# Patient Record
Sex: Male | Born: 1980 | Race: White | Hispanic: No | Marital: Married | Smoking: Former smoker
Health system: Southern US, Community
[De-identification: ages and names within clinical notes are randomized; demographics above are authoritative.]

## PROBLEM LIST (undated history)

## (undated) HISTORY — PX: BICEPS TENDON REPAIR: SHX566

---

## 2016-08-24 ENCOUNTER — Ambulatory Visit
Admission: EM | Admit: 2016-08-24 | Discharge: 2016-08-24 | Disposition: A | Discharge status: Home / Self Care | Payer: 59 | Attending: Family Medicine | Admitting: Family Medicine

## 2016-08-24 DIAGNOSIS — B356 Tinea cruris: Secondary | ICD-10-CM

## 2016-08-24 DIAGNOSIS — R21 Rash and other nonspecific skin eruption: Secondary | ICD-10-CM

## 2016-08-24 MED ORDER — KETOCONAZOLE 2 % EX CREA: 1.0000 "application " | TOPICAL_CREAM | Freq: Two times a day (BID) | CUTANEOUS | 1 refills | Status: DC

## 2016-08-24 NOTE — ED Triage Notes (Signed)
Patient complains of a rash in his groin area, he states he's always sweaty at work and it may be a yeast infection. Its irritated, and itches.

## 2016-08-24 NOTE — ED Provider Notes (Signed)
[facility]    CSN: [id number] Arrival date & time: 08/24/16  1026  First Provider Contact:  First MD Initiated Contact with Patient 08/24/16 1126        History   Chief Complaint Chief Complaint  Patient presents with  . Rash    Groin Area    HPI Charles Burgess is a 35 y.o. male.   She is here because of rash. States rash going on for about 2 weeks now. He uses over-the-counter fungal medicines but has not helped. He thinks he has a yeast infection is not sure. No previous surgery or medical problems. His father has a history of hypertension. No known drug allergies. The patient does not smoke.   The history is provided by the patient. No language interpreter was used.  Rash  Location:  Pelvis Pelvic rash location:  Groin Quality: itchiness and redness   Severity:  Moderate Onset quality:  Unable to specify Timing:  Constant Progression:  Worsening Chronicity:  New Context: not animal contact, not chemical exposure, not diapers, not eggs, not exposure to similar rash, not hot tub use, not insect bite/sting, not medications, not new detergent/soap, not nuts, not plant contact, not pollen, not pregnancy, not sick contacts and not sun exposure   Relieved by:  Anti-fungal cream Worsened by:  Moisture Ineffective treatments:  Anti-fungal cream Associated symptoms: no abdominal pain, no diarrhea, no fatigue, no fever, no induration, no joint pain, no myalgias, no nausea, no sore throat, no throat swelling, no tongue swelling and no URI     History reviewed. No pertinent past medical history.  There are no active problems to display for this patient.   History reviewed. No pertinent surgical history.     Home Medications    Prior to Admission medications   Medication Sig Start Date End Date Taking? Authorizing Provider  ketoconazole (NIZORAL) 2 % cream Apply 1 application topically 2 (two) times daily. 08/24/16   Hassan RowanEugene Aliscia Clayton, MD    Family History Family  History  Problem Relation Age of Onset  . Hypertension Father     Social History Social History  Substance Use Topics  . Smoking status: Never Smoker  . Smokeless tobacco: Never Used  . Alcohol use No     Allergies   Review of patient's allergies indicates no known allergies.   Review of Systems Review of Systems  Constitutional: Negative for fatigue and fever.  HENT: Negative for sore throat.   Gastrointestinal: Negative for abdominal pain, diarrhea and nausea.  Musculoskeletal: Negative for arthralgias and myalgias.  Skin: Positive for rash.  All other systems reviewed and are negative.    Physical Exam Triage Vital Signs ED Triage Vitals  Enc Vitals Group     BP 08/24/16 1054 (!) 126/91     Pulse Rate 08/24/16 1054 69     Resp 08/24/16 1054 18     Temp 08/24/16 1054 98 F (36.7 C)     Temp Source 08/24/16 1054 Oral     SpO2 08/24/16 1054 100 %     Weight 08/24/16 1052 192 lb (87.1 kg)     Height 08/24/16 1052 5\' 8"  (1.727 m)     Head Circumference --      Peak Flow --      Pain Score 08/24/16 1053 0     Pain Loc --      Pain Edu? --      Excl. in GC? --    No data found.  Updated Vital Signs BP (!) 126/91 (BP Location: Left Arm)   Pulse 69   Temp 98 F (36.7 C) (Oral)   Resp 18   Ht 5\' 8"  (1.727 m)   Wt 192 lb (87.1 kg)   SpO2 100%   BMI 29.19 kg/m   Visual Acuity Right Eye Distance:   Left Eye Distance:   Bilateral Distance:    Right Eye Near:   Left Eye Near:    Bilateral Near:     Physical Exam  Constitutional: He appears well-developed and well-nourished.  HENT:  Head: Normocephalic and atraumatic.  Eyes: Pupils are equal, round, and reactive to light.  Neck: Normal range of motion.  Pulmonary/Chest: Effort normal.  Musculoskeletal: Normal range of motion.  Neurological: He is alert.  Skin: Rash noted. There is erythema.     Red erythema type rash with some satellite lesions consistent with a fungal infection of the groin  area or tinea cruris  Psychiatric: He has a normal mood and affect.  Vitals reviewed.    UC Treatments / Results  Labs (all labs ordered are listed, but only abnormal results are displayed) Labs Reviewed - No data to display  EKG  EKG Interpretation None       Radiology No results found.  Procedures Procedures (including critical care time)  Medications Ordered in UC Medications - No data to display   Initial Impression / Assessment and Plan / UC Course  I have reviewed the triage vital signs and the nursing notes.  Pertinent labs & imaging results that were available during my care of the patient were reviewed by me and considered in my medical decision making (see chart for details).  Clinical Course    Tinea cruris recommend strongly a void moisture in the area where boxes for the next 2 weeks Nizoral cream for 2 weeks recommend possible commando at nighttime or Au natural at qhs also recommend changing underwear twice a day at work since she sweats a lot at work. Follow-up with his PCP of choice if not better in 2 weeks  Final Clinical Impressions(s) / UC Diagnoses   Final diagnoses:  Rash  Tinea cruris    New Prescriptions New Prescriptions   KETOCONAZOLE (NIZORAL) 2 % CREAM    Apply 1 application topically 2 (two) times daily.     Hassan Rowan, MD 08/24/16 1147

## 2017-01-09 ENCOUNTER — Ambulatory Visit
Admission: EM | Admit: 2017-01-09 | Discharge: 2017-01-09 | Disposition: A | Discharge status: Home / Self Care | Payer: BLUE CROSS/BLUE SHIELD | Attending: Family Medicine | Admitting: Family Medicine

## 2017-01-09 ENCOUNTER — Encounter: Payer: Self-pay | Admitting: Emergency Medicine

## 2017-01-09 ENCOUNTER — Ambulatory Visit (INDEPENDENT_AMBULATORY_CARE_PROVIDER_SITE_OTHER): Payer: BLUE CROSS/BLUE SHIELD

## 2017-01-09 DIAGNOSIS — R05 Cough: Secondary | ICD-10-CM | POA: Diagnosis not present

## 2017-01-09 DIAGNOSIS — J069 Acute upper respiratory infection, unspecified: Secondary | ICD-10-CM | POA: Diagnosis not present

## 2017-01-09 DIAGNOSIS — J209 Acute bronchitis, unspecified: Secondary | ICD-10-CM

## 2017-01-09 DIAGNOSIS — R059 Cough, unspecified: Secondary | ICD-10-CM

## 2017-01-09 LAB — RAPID INFLUENZA A&B ANTIGENS
Influenza A (ARMC): NEGATIVE
Influenza B (ARMC): NEGATIVE

## 2017-01-09 MED ORDER — ALBUTEROL SULFATE HFA 108 (90 BASE) MCG/ACT IN AERS: 2.0000 | INHALATION_SPRAY | RESPIRATORY_TRACT | 0 refills | Status: DC | PRN

## 2017-01-09 MED ORDER — HYDROCOD POLST-CPM POLST ER 10-8 MG/5ML PO SUER: 5.0000 mL | Freq: Two times a day (BID) | ORAL | 0 refills | Status: DC | PRN

## 2017-01-09 MED ORDER — PREDNISONE 10 MG (21) PO TBPK: ORAL_TABLET | ORAL | 0 refills | Status: DC

## 2017-01-09 MED ORDER — AZITHROMYCIN 500 MG PO TABS
ORAL_TABLET | ORAL | 0 refills | Status: DC
Start: 2017-01-09 — End: 2018-08-19

## 2017-01-09 NOTE — ED Provider Notes (Signed)
[facility]    CSN: [id number] Arrival date & time: 01/09/17  0957     History   Chief Complaint Chief Complaint  Patient presents with  . Cough    HPI Charles Burgess is a 36 y.o. male.   Patient is a 36 year old white male who reports having a cough and congestion. The cough and congestion started about 2 weeks ago and has persisted. Reports lately the cough and congestion has gotten worse. He reports chills aching all over and some fever as well. He did not get his flu shot this year. No chronic medical problems. His father has a history of hypertension. No previous surgeries operation he does not smoke. He states his wife is a Engineer, civil (consulting)nurse and she was worried that he might have pneumonia by this time. Reports coughing up yellowish-green sputum at times bronchospasms present as well. No family medical history pertinent to today's visit.   The history is provided by the patient. No language interpreter was used.  Cough  Cough characteristics:  Productive Sputum characteristics:  Yellow and green Severity:  Moderate Onset quality:  Sudden Timing:  Constant Progression:  Worsening Chronicity:  New Smoker: no   Context: upper respiratory infection   Relieved by:  Nothing Worsened by:  Activity Ineffective treatments:  None tried Associated symptoms: chills, fever, shortness of breath and wheezing     History reviewed. No pertinent past medical history.  There are no active problems to display for this patient.   History reviewed. No pertinent surgical history.     Home Medications    Prior to Admission medications   Medication Sig Start Date End Date Taking? Authorizing Provider  albuterol (PROVENTIL HFA;VENTOLIN HFA) 108 (90 Base) MCG/ACT inhaler Inhale 2 puffs into the lungs every 4 (four) hours as needed for wheezing or shortness of breath. 01/09/17   Hassan RowanEugene Demyan Fugate, MD  azithromycin (ZITHROMAX) 500 MG tablet 1 tablet by mouth daily for 5 days 01/09/17   Hassan RowanEugene  Kaila Devries, MD  chlorpheniramine-HYDROcodone [facility](TUSSIONEX PENNKINETIC ER) 10-8 MG/5ML SUER Take 5 mLs by mouth every 12 (twelve) hours as needed for cough. 01/09/17   Hassan RowanEugene Banyan Goodchild, MD  predniSONE (STERAPRED UNI-PAK 21 TAB) 10 MG (21) TBPK tablet Sig 6 tablet day 1, 5 tablets day 2, 4 tablets day 3,,3tablets day 4, 2 tablets day 5, 1 tablet day 6 take all tablets orally 01/09/17   Hassan RowanEugene Adisen Bennion, MD    Family History Family History  Problem Relation Age of Onset  . Hypertension Father     Social History Social History  Substance Use Topics  . Smoking status: Never Smoker  . Smokeless tobacco: Never Used  . Alcohol use No     Allergies   Patient has no known allergies.   Review of Systems Review of Systems  Constitutional: Positive for chills and fever.  Respiratory: Positive for cough, shortness of breath and wheezing.   All other systems reviewed and are negative.    Physical Exam Triage Vital Signs ED Triage Vitals  Enc Vitals Group     BP 01/09/17 1046 (!) 142/90     Pulse Rate 01/09/17 1046 (!) 106     Resp 01/09/17 1046 16     Temp 01/09/17 1046 99.3 F (37.4 C)     Temp Source 01/09/17 1046 Oral     SpO2 01/09/17 1046 99 %     Weight 01/09/17 1045 196 lb (88.9 kg)     Height 01/09/17 1045 5\' 8"  (1.727 m)  Head Circumference --      Peak Flow --      Pain Score 01/09/17 1047 3     Pain Loc --      Pain Edu? --      Excl. in GC? --    No data found.   Updated Vital Signs BP (!) 142/90 (BP Location: Right Arm)   Pulse (!) 106   Temp 99.3 F (37.4 C) (Oral)   Resp 16   Ht 5\' 8"  (1.727 m)   Wt 196 lb (88.9 kg)   SpO2 99%   BMI 29.80 kg/m   Visual Acuity Right Eye Distance:   Left Eye Distance:   Bilateral Distance:    Right Eye Near:   Left Eye Near:    Bilateral Near:     Physical Exam  Constitutional: He is oriented to person, place, and time. He appears well-developed and well-nourished.  HENT:  Head: Normocephalic and atraumatic.  Right Ear:  External ear normal.  Left Ear: External ear normal.  Mouth/Throat: Oropharynx is clear and moist.  Eyes: Pupils are equal, round, and reactive to light.  Neck: Normal range of motion. Neck supple.  Cardiovascular: Normal rate and regular rhythm.   Pulmonary/Chest: He has wheezes.  Musculoskeletal: Normal range of motion. He exhibits no deformity.  Lymphadenopathy:    He has cervical adenopathy.  Neurological: He is alert and oriented to person, place, and time.  Skin: Skin is warm.  Psychiatric: He has a normal mood and affect.  Vitals reviewed.    UC Treatments / Results  Labs (all labs ordered are listed, but only abnormal results are displayed) Labs Reviewed  RAPID INFLUENZA A&B ANTIGENS [facility] ONLY)    EKG  EKG Interpretation None       Radiology Dg Chest 2 View  Result Date: 01/09/2017 CLINICAL DATA:  Chest congestion.  Cough and fever. EXAM: CHEST  2 VIEW COMPARISON:  None. FINDINGS: The heart size and mediastinal contours are within normal limits. Both lungs are clear. The visualized skeletal structures are unremarkable. IMPRESSION: No active cardiopulmonary disease. Electronically Signed   By: Signa Kell M.D.   On: 01/09/2017 12:34    Procedures Procedures (including critical care time)  Medications Ordered in UC Medications - No data to display  Results for orders placed or performed during the hospital encounter of 01/09/17  Rapid Influenza A&B Antigens ([facility] only)  Result Value Ref Range   Influenza A ([facility]) NEGATIVE NEGATIVE   Influenza B ([facility]) NEGATIVE NEGATIVE   Initial Impression / Assessment and Plan / UC Course  I have reviewed the triage vital signs and the nursing notes.  Pertinent labs & imaging results that were available during my care of the patient were reviewed by me and considered in my medical decision making (see chart for details).      We'll get a chest x-ray on patient pending results chest x-ray will determine which  antibiotic to use we'll place him on albuterol inhaler Tussionex for the cough and follow-up with PCP if not better in 1-2 weeks and work note for today and tomorrow and Monday.  Final Clinical Impressions(s) / UC Diagnoses   Final diagnoses:  Cough  Upper respiratory tract infection, unspecified type  Acute bronchitis with bronchospasm    New Prescriptions New Prescriptions   ALBUTEROL (PROVENTIL HFA;VENTOLIN HFA) 108 (90 BASE) MCG/ACT INHALER    Inhale 2 puffs into the lungs every 4 (four) hours as needed for wheezing or shortness of breath.  AZITHROMYCIN (ZITHROMAX) 500 MG TABLET    1 tablet by mouth daily for 5 days   CHLORPHENIRAMINE-HYDROCODONE (TUSSIONEX PENNKINETIC ER) 10-8 MG/5ML SUER    Take 5 mLs by mouth every 12 (twelve) hours as needed for cough.   PREDNISONE (STERAPRED UNI-PAK 21 TAB) 10 MG (21) TBPK TABLET    Sig 6 tablet day 1, 5 tablets day 2, 4 tablets day 3,,3tablets day 4, 2 tablets day 5, 1 tablet day 6 take all tablets orally    Patient chest x-ray was negative. We'll place on Zithromax for dose 500 daily for 5 days test next 1 teaspoon twice a day 6 day course of prednisone and albuterol inhaler. We'll also work note through Monday may return to work on Tuesday and states he PCP if not better by Tuesday or later in the week.    Note: This dictation was prepared with Dragon dictation along with smaller phrase technology. Any transcriptional errors that result from this process are unintentional.     Hassan Rowan, MD 01/09/17 [id number]

## 2017-01-09 NOTE — ED Triage Notes (Signed)
Patient c/o cough and chest congestion for 3 days.  Patient reports fever last night.  Patient reports bodyaches.

## 2018-08-19 ENCOUNTER — Other Ambulatory Visit: Payer: Self-pay

## 2018-08-19 ENCOUNTER — Ambulatory Visit
Admission: EM | Admit: 2018-08-19 | Discharge: 2018-08-19 | Disposition: A | Discharge status: Home / Self Care | Payer: 59 | Attending: Family Medicine | Admitting: Family Medicine

## 2018-08-19 ENCOUNTER — Ambulatory Visit (INDEPENDENT_AMBULATORY_CARE_PROVIDER_SITE_OTHER): Payer: 59

## 2018-08-19 ENCOUNTER — Ambulatory Visit: Payer: 59

## 2018-08-19 ENCOUNTER — Encounter: Payer: Self-pay | Admitting: Emergency Medicine

## 2018-08-19 DIAGNOSIS — S20212A Contusion of left front wall of thorax, initial encounter: Secondary | ICD-10-CM | POA: Diagnosis not present

## 2018-08-19 NOTE — Discharge Instructions (Addendum)
Take over the counter ibuprofen as needed. Deep breaths frequently.   Follow up with your primary care physician this week as needed. Return to Urgent care for new or worsening concerns.

## 2018-08-19 NOTE — ED Triage Notes (Signed)
Patient c/o left side rib pain that happened last Friday when he was playing around with his son. States he can feel his rib pop in and out of place.

## 2018-08-19 NOTE — ED Provider Notes (Signed)
[facility] ____________________________________________  Time seen: Approximately 5:23 PM  I have reviewed the triage vital signs and the nursing notes.   HISTORY  Chief Complaint Muscle Pain   HPI Charles Burgess is a 37 y.o. male presenting for evaluation of left rib pain after injury last Friday.  States he and his 65 year old son were playing around, and his 85 year old son accidentally elbowed him directly in his left ribs.  States that he immediately felt pain to the same area, and states the pain has continued.  Denies any pain prior to injury.  States chest otherwise feels fine.  States pain is fully reproducible by direct palpation as well as certain movements.  States hurts worse with overhead movement and crawling as he reports that he often has to crawl during his job.  Some pain with deep breath.  Patient states that he can feel a bone moving when he presses on the painful part, and states he can feel popping.  No pain radiation, paresthesias, arm radiation, other chest pain, shortness of breath or other complaints.  Has not taken any medications for the same complaint.  Denies other complaints and reports otherwise feels well.  History reviewed. No pertinent past medical history. Denies  There are no active problems to display for this patient.   History reviewed. No pertinent surgical history.   No current facility-administered medications for this encounter.  No current outpatient medications on file.  Allergies Patient has no known allergies.  Family History  Problem Relation Age of Onset  . Hypertension Father     Social History Social History   Tobacco Use  . Smoking status: Former Games developer  . Smokeless tobacco: Never Used  Substance Use Topics  . Alcohol use: Yes    Comment: social  . Drug use: No    Review of Systems Constitutional: No fever/chills Cardiovascular: Denies chest pain. Respiratory: Denies shortness of  breath. Gastrointestinal: No abdominal pain.  Musculoskeletal: Negative for back pain. Skin: Negative for rash.   ____________________________________________   PHYSICAL EXAM:  VITAL SIGNS: ED Triage Vitals  Enc Vitals Group     BP 08/19/18 1624 (!) 153/88     Pulse Rate 08/19/18 1624 73     Resp 08/19/18 1624 16     Temp 08/19/18 1624 98 F (36.7 C)     Temp Source 08/19/18 1624 Oral     SpO2 08/19/18 1624 99 %     Weight 08/19/18 1625 200 lb (90.7 kg)     Height 08/19/18 1625 5\' 8"  (1.727 m)     Head Circumference --      Peak Flow --      Pain Score 08/19/18 1625 8     Pain Loc --      Pain Edu? --      Excl. in GC? --     Constitutional: Alert and oriented. Well appearing and in no acute distress. ENT      Head: Normocephalic and atraumatic. Cardiovascular: Normal rate, regular rhythm. Grossly normal heart sounds.  Good peripheral circulation. Respiratory: Normal respiratory effort without tachypnea nor retractions. Breath sounds are clear and equal bilaterally. No wheezes, rales, rhonchi. Gastrointestinal:  No CVA tenderness. Musculoskeletal:  No midline cervical, thoracic or lumbar tenderness to palpation.  Except: Left anterior lower midclavicular line along 8 through 10 ribs tenderness to direct palpation and fully reproducible by direct palpation, no ecchymosis, no erythema, no palpable rib fracture, chest otherwise nontender. Neurologic:  Normal speech and language. Speech is normal.  No gait instability.  Skin:  Skin is warm, dry and intact. No rash noted. Psychiatric: Mood and affect are normal. Speech and behavior are normal. Patient exhibits appropriate insight and judgment   ___________________________________________   LABS (all labs ordered are listed, but only abnormal results are displayed)  Labs Reviewed - No data to display ____________________________________________  RADIOLOGY  Dg Ribs Unilateral W/chest Left  Result Date:  08/19/2018 CLINICAL DATA:  37 year old male with left rib pain for the past week EXAM: LEFT RIBS AND CHEST - 3+ VIEW COMPARISON:  Prior chest x-ray 01/09/2017 FINDINGS: The lungs are clear and negative for focal airspace consolidation, pulmonary edema or suspicious pulmonary nodule. No pleural effusion or pneumothorax. Cardiac and mediastinal contours are within normal limits. No acute fracture or lytic or blastic osseous lesions. The visualized upper abdominal bowel gas pattern is unremarkable. IMPRESSION: Negative. Electronically Signed   By: Malachy MoanHeath  McCullough M.D.   On: 08/19/2018 16:52   ____________________________________________   PROCEDURES Procedures    INITIAL IMPRESSION / ASSESSMENT AND PLAN / ED COURSE  Pertinent labs & imaging results that were available during my care of the patient were reviewed by me and considered in my medical decision making (see chart for details).  Well-appearing patient.  No acute distress.  Presenting for evaluation of rib pain post injury that occurred last week.  States pain onset was immediate when sons elbow hit his ribs.  Reports pain is fully reproducible by direct palpation, and only present and area of direct injury from elbow.  Repeat x-rays as above per radiologist and reviewed by myself.  No visible rib fracture noted.  Suspect rib contusion.  Patient denies any other chest pain.  Encourage supportive care, over-the-counter ibuprofen, deep breaths and discuss strict follow-up and return parameters.  Discussed follow up with Primary care physician this week as needed.  Discussed follow up and return parameters including no resolution or any worsening concerns. Patient verbalized understanding and agreed to plan.   ____________________________________________   FINAL CLINICAL IMPRESSION(S) / ED DIAGNOSES  Final diagnoses:  Contusion of rib on left side, initial encounter     ED Discharge Orders    None       Note: This dictation was  prepared with Dragon dictation along with smaller phrase technology. Any transcriptional errors that result from this process are unintentional.         Renford DillsMiller, Angelita Harnack, NP 08/19/18 1753

## 2018-11-21 ENCOUNTER — Encounter: Payer: Self-pay | Admitting: Family Medicine

## 2018-11-21 ENCOUNTER — Ambulatory Visit: Payer: 59 | Admitting: Family Medicine

## 2018-11-21 VITALS — BP 136/82 | HR 75 | Temp 98.4°F | Ht 68.0 in | Wt 214.2 lb

## 2018-11-21 DIAGNOSIS — Z0000 Encounter for general adult medical examination without abnormal findings: Secondary | ICD-10-CM | POA: Diagnosis not present

## 2018-11-21 DIAGNOSIS — L304 Erythema intertrigo: Secondary | ICD-10-CM | POA: Diagnosis not present

## 2018-11-21 DIAGNOSIS — L03012 Cellulitis of left finger: Secondary | ICD-10-CM

## 2018-11-21 DIAGNOSIS — Z114 Encounter for screening for human immunodeficiency virus [HIV]: Secondary | ICD-10-CM | POA: Diagnosis not present

## 2018-11-21 DIAGNOSIS — Z13220 Encounter for screening for lipoid disorders: Secondary | ICD-10-CM

## 2018-11-21 DIAGNOSIS — Z23 Encounter for immunization: Secondary | ICD-10-CM | POA: Diagnosis not present

## 2018-11-21 DIAGNOSIS — R635 Abnormal weight gain: Secondary | ICD-10-CM

## 2018-11-21 MED ORDER — FLUCONAZOLE 150 MG PO TABS: 150.0000 mg | ORAL_TABLET | Freq: Once | ORAL | 0 refills | Status: AC

## 2018-11-21 MED ORDER — DOXYCYCLINE HYCLATE 100 MG PO TABS
100.0000 mg | ORAL_TABLET | Freq: Two times a day (BID) | ORAL | 0 refills | Status: DC
Start: 2018-11-21 — End: 2019-09-25

## 2018-11-21 MED ORDER — NYSTATIN-TRIAMCINOLONE 100000-0.1 UNIT/GM-% EX OINT: 1.0000 "application " | TOPICAL_OINTMENT | Freq: Two times a day (BID) | CUTANEOUS | 0 refills | Status: DC

## 2018-11-21 NOTE — Patient Instructions (Signed)
GETTING TO GOOD BOWEL HEALTH  Irregular bowel habits such as constipation can lead to many problems over time.  Having one soft bowel movement a day is the most important way to prevent further problems.    The goal: ONE SOFT BOWEL MOVEMENT A DAY!  To have soft, regular bowel movements:  . Drink at least 8 tall glasses of water a day.   . Take plenty of fiber.  Fiber is the undigested part of plant food that passes into the colon, acting s "natures broom" to encourage bowel motility and movement.  Fiber can absorb and hold large amounts of water. This results in a larger, bulkier stool, which is soft and easier to pass. Work gradually over several weeks up to 6 servings a day of fiber (25g a day even more if needed) in the form of: o Vegetables -- Root (potatoes, carrots, turnips), leafy green (lettuce, salad greens, celery, spinach), or cooked high residue (cabbage, broccoli, etc) o Fruit -- Fresh (unpeeled skin & pulp), Dried (prunes, apricots, cherries, etc ),  or stewed ( applesauce)  o Whole grain breads, pasta, etc (whole wheat)  o Bran cereals  . Bulking Agents -- This type of water-retaining fiber generally is easily obtained each day by one of the following:  o Psyllium bran -- The psyllium plant is remarkable because its ground seeds can retain so much water. This product is available as Metamucil, Konsyl, Effersyllium, Per Diem Fiber, or the less expensive generic preparation in drug and health food stores. Although labeled a laxative, it really is not a laxative.  o Methylcellulose -- This is another fiber derived from wood which also retains water. It is available as Citrucel. o Polyethylene Glycol - and "artificial" fiber commonly called Miralax or Glycolax.  It is helpful for people with gassy or bloated feelings with regular fiber o Flax Seed - a less gassy fiber than psyllium . No reading or other relaxing activity while on the toilet. If bowel movements take longer than 5 minutes,  you are too constipated. . AVOID CONSTIPATION.  High fiber and water intake usually takes care of this.  Sometimes a laxative is needed to stimulate more frequent bowel movements, but  . Laxatives are not a good long-term solution as it can wear the colon out. o Osmotics (Milk of Magnesia, Fleets phosphosoda, Magnesium citrate, MiraLax, GoLytely) are safer than  o Stimulants (Senokot, Castor Oil, Dulcolax, Ex Lax)    o Do not take laxatives for more than 7days in a row. .  IF SEVERELY CONSTIPATED, try a Bowel Retraining Program: o Do not use laxatives.  o Eat a diet high in roughage, such as bran cereals and leafy vegetables.  o Drink six (6) ounces of prune or apricot juice each morning.  o Eat two (2) large servings of stewed fruit each day.  o Take one (1) heaping tablespoon of a psyllium-based bulking agent twice a day. Use sugar-free sweetener when possible to avoid excessive calories.  o Eat a normal breakfast.  o Set aside 15 minutes after breakfast to sit on the toilet, but do not strain to have a bowel movement.  o If you do not have a bowel movement by the third day, use an enema and repeat the above steps.   

## 2018-11-21 NOTE — Progress Notes (Signed)
.   Subjective:    Charles HatchetJohn Burgess is a 37 y.o. male who presents today for his Complete Annual Exam.  Patient in office to establish care. He does have sore on left pinky finger started about a week ago as small cut. Did have yellow discharge coming from it but better now. Not tender to touch.   Health Maintenance Due  Topic Date Due  . HIV Screening  06/20/1996   PMHx, SurgHx, SocialHx, Medications, and Allergies were reviewed in the Visit Navigator and updated as appropriate.   History reviewed. No pertinent past medical history.  History reviewed. No pertinent surgical history.  Family History  Problem Relation Age of Onset  . Hypertension Father   . Breast cancer Maternal Grandmother    Social History   Tobacco Use  . Smoking status: Former Smoker    Types: Cigarettes    Last attempt to quit: 11/21/2013    Years since quitting: 5.0  . Smokeless tobacco: Never Used  Substance Use Topics  . Alcohol use: Yes    Comment: social  . Drug use: No   Review of Systems:   Pertinent items are noted in the HPI. Otherwise, ROS is negative.  Objective:   Vitals:   11/21/18 1528  BP: 136/82  Pulse: 75  Temp: 98.4 F (36.9 C)  SpO2: 97%   Body mass index is 32.57 kg/m.  General  Alert, cooperative, no distress, appears stated age  Head:  Normocephalic, without obvious abnormality, atraumatic  Eyes:  PERRL, conjunctiva/corneas clear, EOM's intact, fundi benign, both eyes       Ears:  Normal TM's and external ear canals, both ears  Nose: Nares normal, septum midline, mucosa normal, no drainage or sinus tenderness  Throat: Lips, mucosa, and tongue normal; teeth and gums normal  Neck: Supple, symmetrical, trachea midline, no adenopathy; thyroid: no enlargement/tenderness/nodules; no carotid bruit or JVD  Back:   Symmetric, no curvature, ROM normal, no CVA tenderness  Lungs:   Clear to auscultation bilaterally, respirations unlabored  Chest Wall:  No tenderness or deformity    Heart:  Regular rate and rhythm, S1 and S2 normal, no murmur, rub or gallop  Abdomen:   Soft, non-tender, bowel sounds active all four quadrants, no masses, no organomegaly  Extremities: Extremities normal, atraumatic, no cyanosis or edema  Prostate: Not done.  Skin: Skin color, texture, turgor normal, no rashes or lesions  Lymph: Cervical, supraclavicular, and axillary nodes normal  Neurologic: CNII-XII grossly intact. Normal strength, sensation and reflexes throughout   AssessmentPlan:   Charles RuizJohn was seen today for establish care.  Diagnoses and all orders for this visit:  Routine physical examination  Encounter for screening for HIV -     HIV Antibody (routine testing w rflx)  Cellulitis of finger of left hand -     CBC with Differential/Platelet -     Comprehensive metabolic panel -     doxycycline (VIBRA-TABS) 100 MG tablet; Take 1 tablet (100 mg total) by mouth 2 (two) times daily.  Pruritic intertrigo -     nystatin-triamcinolone ointment (MYCOLOG); Apply 1 application topically 2 (two) times daily. -     fluconazole (DIFLUCAN) 150 MG tablet; Take 1 tablet (150 mg total) by mouth once for 1 dose.  Screening for lipid disorders -     Lipid panel  Weight gain -     TSH  Need for Tdap vaccination -     Tdap vaccine greater than or equal to 7yo IM  Patient Counseling: [x]   Nutrition: Stressed importance of moderation in sodium/caffeine intake, saturated fat and cholesterol, caloric balance, sufficient intake of fresh fruits, vegetables, and fiber  [x]   Stressed the importance of regular exercise.   []   Substance Abuse: Discussed cessation/primary prevention of tobacco, alcohol, or other drug use; driving or other dangerous activities under the influence; availability of treatment for abuse.   [x]   Injury prevention: Discussed safety belts, safety helmets, smoke detector, smoking near bedding or upholstery.   []   Sexuality: Discussed sexually transmitted diseases, partner  selection, use of condoms, avoidance of unintended pregnancy  and contraceptive alternatives.   [x]   Dental health: Discussed importance of regular tooth brushing, flossing, and dental visits.  [x]   Health maintenance and immunizations reviewed. Please refer to Health maintenance section.   Helane Rima, DO [facility]

## 2018-11-22 ENCOUNTER — Other Ambulatory Visit: Payer: Self-pay

## 2018-11-22 ENCOUNTER — Telehealth: Payer: Self-pay | Admitting: Radiology

## 2018-11-22 ENCOUNTER — Other Ambulatory Visit: Payer: Self-pay | Admitting: Family Medicine

## 2018-11-22 ENCOUNTER — Encounter: Payer: Self-pay | Admitting: Family Medicine

## 2018-11-22 DIAGNOSIS — R899 Unspecified abnormal finding in specimens from other organs, systems and tissues: Secondary | ICD-10-CM

## 2018-11-22 LAB — TSH: TSH: 1.63 u[IU]/mL (ref 0.35–4.50)

## 2018-11-22 LAB — CBC WITH DIFFERENTIAL/PLATELET
Basophils Absolute: 0.1 10*3/uL (ref 0.0–0.1)
Basophils Relative: 1.6 % (ref 0.0–3.0)
Eosinophils Absolute: 0.2 10*3/uL (ref 0.0–0.7)
Eosinophils Relative: 2.1 % (ref 0.0–5.0)
HCT: 43.5 % (ref 39.0–52.0)
Hemoglobin: 14.9 g/dL (ref 13.0–17.0)
Lymphocytes Relative: 20 % (ref 12.0–46.0)
Lymphs Abs: 1.7 10*3/uL (ref 0.7–4.0)
MCHC: 34.2 g/dL (ref 30.0–36.0)
MCV: 84.4 fl (ref 78.0–100.0)
Monocytes Absolute: 0.6 10*3/uL (ref 0.1–1.0)
Monocytes Relative: 7 % (ref 3.0–12.0)
Neutro Abs: 5.8 10*3/uL (ref 1.4–7.7)
Neutrophils Relative %: 69.3 % (ref 43.0–77.0)
Platelets: 407 10*3/uL — ABNORMAL HIGH (ref 150.0–400.0)
RBC: 5.15 Mil/uL (ref 4.22–5.81)
RDW: 13.3 % (ref 11.5–15.5)
WBC: 8.4 10*3/uL (ref 4.0–10.5)

## 2018-11-22 LAB — LIPID PANEL
Cholesterol: 250 mg/dL — ABNORMAL HIGH (ref 0–200)
HDL: 34.6 mg/dL — ABNORMAL LOW (ref >39.00)
NonHDL: 215.16
Total CHOL/HDL Ratio: 7
Triglycerides: 260 mg/dL — ABNORMAL HIGH (ref 0.0–149.0)
VLDL: 52 mg/dL — ABNORMAL HIGH (ref 0.0–40.0)

## 2018-11-22 LAB — COMPREHENSIVE METABOLIC PANEL
ALT: 55 U/L — ABNORMAL HIGH (ref 0–53)
AST: 29 U/L (ref 0–37)
Albumin: 4.8 g/dL (ref 3.5–5.2)
Alkaline Phosphatase: 76 U/L (ref 39–117)
BUN: 12 mg/dL (ref 6–23)
CO2: 29 mEq/L (ref 19–32)
Calcium: 9.9 mg/dL (ref 8.4–10.5)
Chloride: 102 mEq/L (ref 96–112)
Creatinine, Ser: 1.11 mg/dL (ref 0.40–1.50)
GFR: 79.04 mL/min (ref >60.00)
Glucose, Bld: 74 mg/dL (ref 70–99)
Potassium: 6.4 mEq/L (ref 3.5–5.1)
Sodium: 138 mEq/L (ref 135–145)
Total Bilirubin: 0.5 mg/dL (ref 0.2–1.2)
Total Protein: 7.5 g/dL (ref 6.0–8.3)

## 2018-11-22 LAB — LDL CHOLESTEROL, DIRECT: Direct LDL: 177 mg/dL

## 2018-11-22 LAB — HIV ANTIBODY (ROUTINE TESTING W REFLEX): HIV 1&2 Ab, 4th Generation: NONREACTIVE

## 2018-11-22 NOTE — Telephone Encounter (Signed)
Called spoke to wife put order in for him to get at outside lab need to call and check and see if he had done so that we know to ask for results.

## 2018-11-22 NOTE — Telephone Encounter (Signed)
Potassium was 6.4 call from lab

## 2018-11-22 NOTE — Telephone Encounter (Signed)
Call patient or his wife. Potassium likely false elevation. [place] Need repeat STAT. Please help arrange.

## 2018-11-22 NOTE — Telephone Encounter (Signed)
Called patient l/m to call office  

## 2018-11-23 LAB — COMPREHENSIVE METABOLIC PANEL
ALT: 57 IU/L — ABNORMAL HIGH (ref 0–44)
AST: 33 IU/L (ref 0–40)
Albumin/Globulin Ratio: 2.2 (ref 1.2–2.2)
Albumin: 4.9 g/dL (ref 3.5–5.5)
Alkaline Phosphatase: 85 IU/L (ref 39–117)
BUN/Creatinine Ratio: 13 (ref 9–20)
BUN: 13 mg/dL (ref 6–20)
Bilirubin Total: 0.4 mg/dL (ref 0.0–1.2)
CO2: 20 mmol/L (ref 20–29)
Calcium: 10 mg/dL (ref 8.7–10.2)
Chloride: 104 mmol/L (ref 96–106)
Creatinine, Ser: 0.99 mg/dL (ref 0.76–1.27)
GFR calc Af Amer: 112 mL/min/{1.73_m2} (ref >59)
GFR calc non Af Amer: 97 mL/min/{1.73_m2} (ref >59)
Globulin, Total: 2.2 g/dL (ref 1.5–4.5)
Glucose: 94 mg/dL (ref 65–99)
Potassium: 4.5 mmol/L (ref 3.5–5.2)
Sodium: 144 mmol/L (ref 134–144)
Total Protein: 7.1 g/dL (ref 6.0–8.5)

## 2018-11-23 LAB — BASIC METABOLIC PANEL
BUN: 13 (ref 4–21)
Glucose: 94
Potassium: 4.5 (ref 3.4–5.3)
Sodium: 144 (ref 137–147)

## 2018-11-23 LAB — HEPATIC FUNCTION PANEL
ALT: 57 — AB (ref 10–40)
AST: 33 (ref 14–40)

## 2018-11-23 NOTE — Telephone Encounter (Signed)
Results received lab normal per Dr. Earlene PlaterWallace. Called patient l/m to let him know.

## 2018-11-23 NOTE — Telephone Encounter (Signed)
Charles Burgess is calling for results.

## 2018-11-29 NOTE — Telephone Encounter (Signed)
Left message on voicemail to call office.  

## 2018-12-01 NOTE — Telephone Encounter (Signed)
Left message on voicemail to call office.  

## 2018-12-05 ENCOUNTER — Encounter: Payer: Self-pay | Admitting: *Deleted

## 2019-01-06 ENCOUNTER — Other Ambulatory Visit: Payer: Self-pay

## 2019-01-06 MED ORDER — AMOXICILLIN 875 MG PO TABS: 875.0000 mg | ORAL_TABLET | Freq: Two times a day (BID) | ORAL | 0 refills | Status: DC

## 2019-09-25 ENCOUNTER — Other Ambulatory Visit: Payer: Self-pay

## 2019-09-25 ENCOUNTER — Ambulatory Visit: Payer: 59 | Admitting: Family Medicine

## 2019-09-25 ENCOUNTER — Encounter: Payer: Self-pay | Admitting: Family Medicine

## 2019-09-25 VITALS — BP 148/100 | HR 75 | Temp 98.7°F | Ht 68.0 in | Wt 212.0 lb

## 2019-09-25 DIAGNOSIS — R21 Rash and other nonspecific skin eruption: Secondary | ICD-10-CM

## 2019-09-25 DIAGNOSIS — R030 Elevated blood-pressure reading, without diagnosis of hypertension: Secondary | ICD-10-CM | POA: Diagnosis not present

## 2019-09-25 MED ORDER — HYDROCORTISONE ACETATE 2.5 % EX CREA: 1.0000 "application " | TOPICAL_CREAM | Freq: Two times a day (BID) | CUTANEOUS | 1 refills | Status: DC

## 2019-09-25 NOTE — Patient Instructions (Signed)
Blood pressure high, f/u in 3 months for annual and blood pressure check.

## 2019-09-25 NOTE — Progress Notes (Signed)
Patient: Charles Burgess MRN: [id number] DOB: 1981/08/10 PCP: Briscoe Deutscher, DO     Subjective:  Chief Complaint  Patient presents with  . rash in groin area    HPI: The patient is a 38 y.o. male who presents today for rash in groin area. He states he has had this for years. It never really goes away. It will come and go. He thinks it gets worse with heat and sweating. It is itchy. It is not spreading, just on his testes and groin area. He has had before. Saw his pcp last year and was given a pill and cream. It was diflucan and mycolog. Cream didn't help at all, but the pill did. He does wear boxers and tries to keep area dry. It does get worse with heat/sweating. Does not spread, mainly on right scrotum.   Review of Systems  Skin: Positive for rash.       C/o rash in groin area.   Itchy and burns after showering. Patient states that he uses an OTC diaper rash cream that gave him relief. (triple antibiotic ointment)  Patient states that rash has been intermittent x 1 year.    Allergies Patient has No Known Allergies.  Past Medical History Patient  has no past medical history on file.  Surgical History Patient  has no past surgical history on file.  Family History Pateint's family history includes Breast cancer in his maternal grandmother; Hypertension in his father.  Social History Patient  reports that he quit smoking about 5 years ago. His smoking use included cigarettes. He has never used smokeless tobacco. He reports current alcohol use. He reports that he does not use drugs.    Objective: Vitals:   09/25/19 1335 09/25/19 1443  BP: (!) 138/104 (!) 148/100  Pulse: 75   Temp: 98.7 F (37.1 C)   TempSrc: Skin   SpO2: 97%   Weight: 212 lb (96.2 kg)   Height: 5\' 8"  (1.727 m)     Body mass index is 32.23 kg/m.  Physical Exam Vitals signs reviewed.  Constitutional:      Appearance: Normal appearance. He is normal weight.  Skin:    Comments: Right scrotum: erythema  and some open excoriated places. No rash on inner leg or groin. No satellite lesions or well defined borders.   Neurological:     Mental Status: He is alert.        Assessment/plan: 1. Rash Seborrheic dermatitis vs. Lichen planus vs. Other rash. Not characteristic of yeast or erythrasma. Will do trial of low dose steroid cream since on testes and see how he does. If not better he is to let me know.   2. Elevated blood pressure reading Just drank a pepsi and is nervous. Quite high. Advised he watch this and follow up in 1-3 months for recheck. Due for annual in December and is more than welcome to follow up with me since his pcp is leaving.     Return in about 2 months (around 11/25/2019) for annual/bp .   Orma Flaming, MD [place]   09/25/2019

## 2019-09-30 IMAGING — CR DG RIBS W/ CHEST 3+V*L*
5 series · 5 of 5 positions shown · non-contrast
Comparison: Prior chest x-ray 01/09/2017

CLINICAL DATA: 37-year-old male with left rib pain for the past
week

EXAM:
LEFT RIBS AND CHEST - 3+ VIEW

[chest pa]
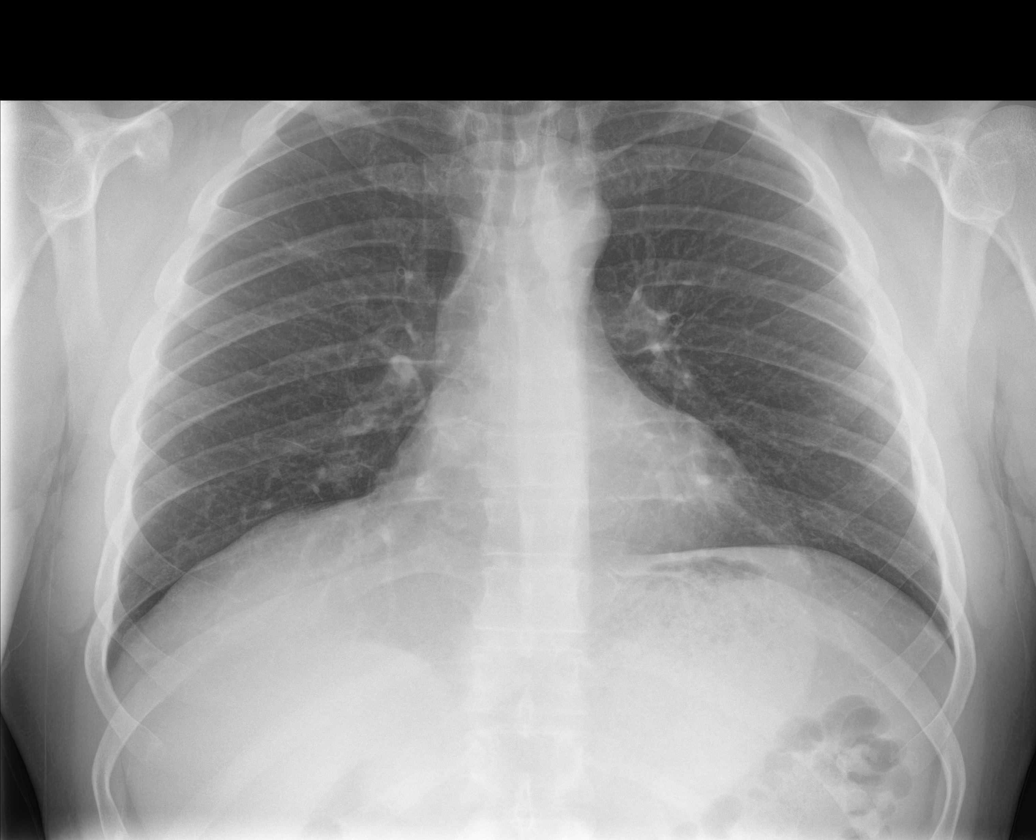

[rib pa (1 of 2)]
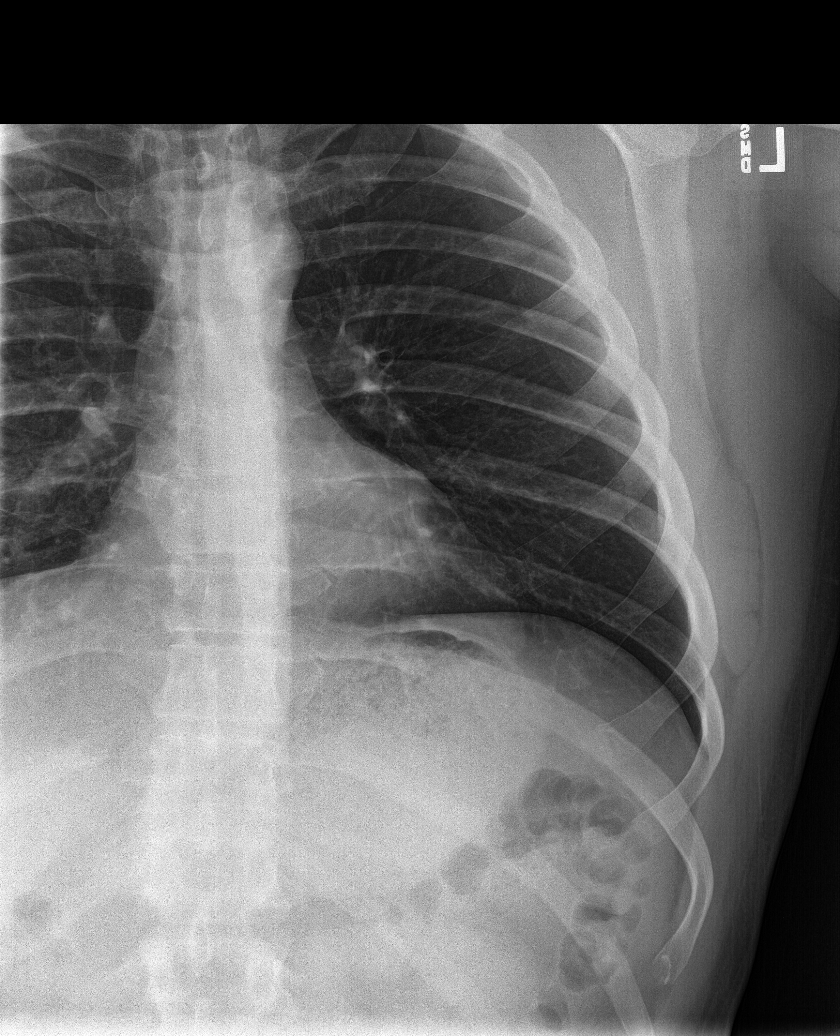

[rib pa (2 of 2)]
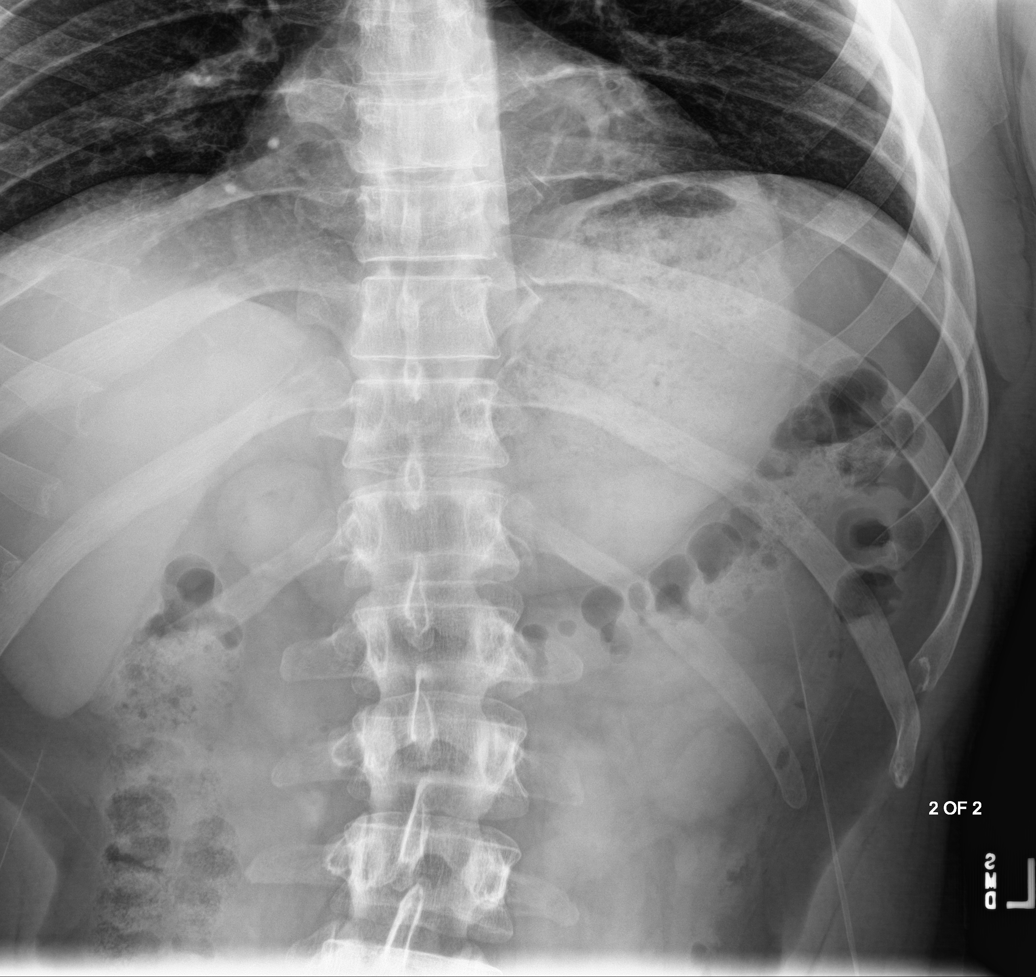

[rib obl (1 of 2)]
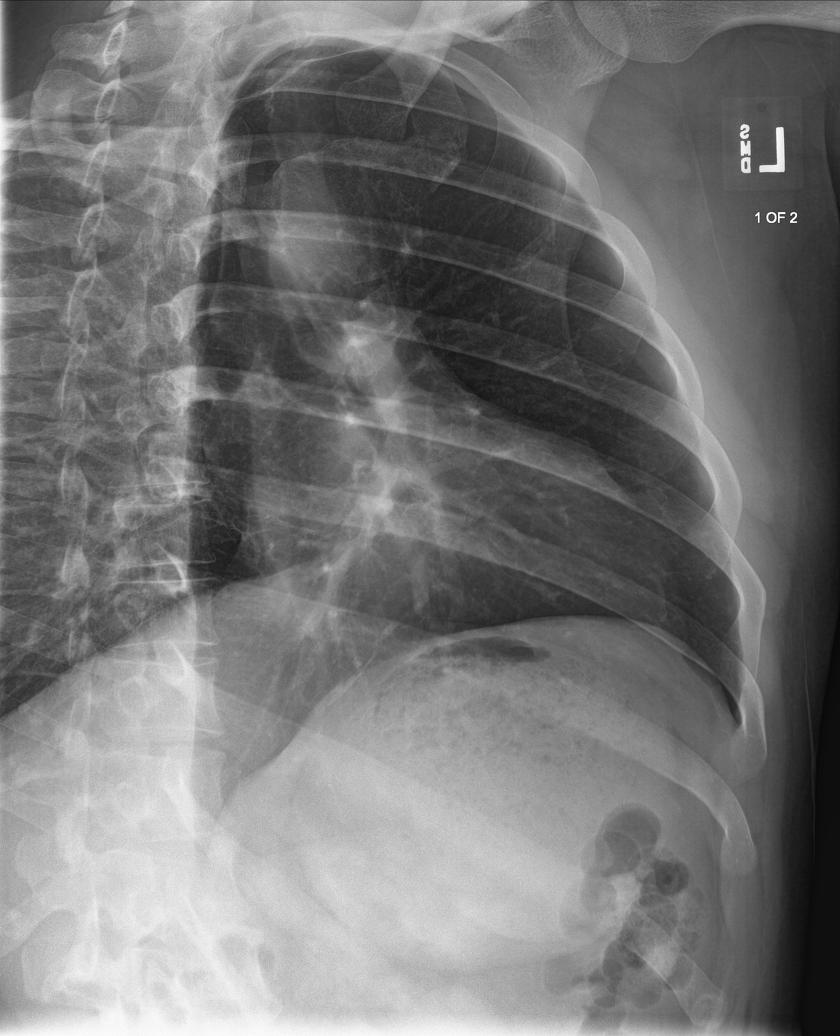

[rib obl (2 of 2)]
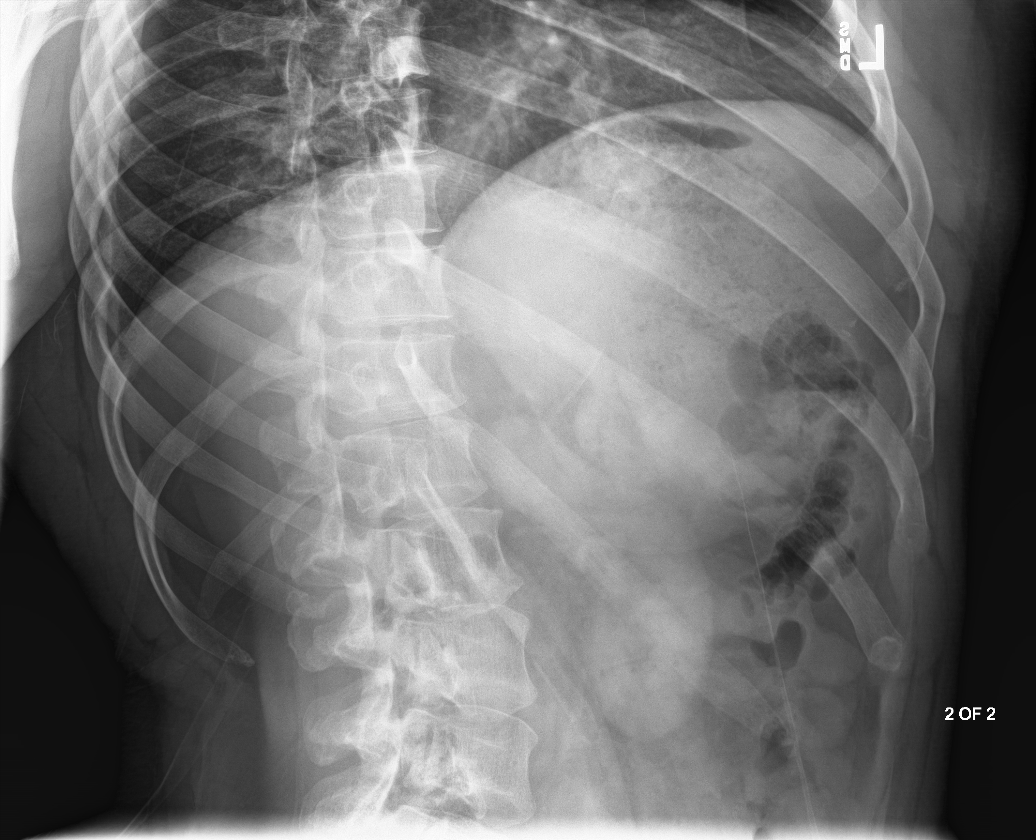

[5 of 5 positions shown; findings below may reference images not displayed]

FINDINGS: The lungs are clear and negative for focal airspace consolidation,
pulmonary edema or suspicious pulmonary nodule. No pleural effusion
or pneumothorax. Cardiac and mediastinal contours are within normal
limits. No acute fracture or lytic or blastic osseous lesions. The
visualized upper abdominal bowel gas pattern is unremarkable.
IMPRESSION: Negative.

## 2021-05-30 ENCOUNTER — Ambulatory Visit (INDEPENDENT_AMBULATORY_CARE_PROVIDER_SITE_OTHER): Payer: BC Managed Care – PPO

## 2021-05-30 ENCOUNTER — Encounter: Payer: Self-pay | Admitting: Emergency Medicine

## 2021-05-30 ENCOUNTER — Other Ambulatory Visit: Payer: Self-pay

## 2021-05-30 ENCOUNTER — Ambulatory Visit
Admission: EM | Admit: 2021-05-30 | Discharge: 2021-05-30 | Disposition: A | Discharge status: Home / Self Care | Payer: BC Managed Care – PPO | Attending: Physician Assistant | Admitting: Physician Assistant

## 2021-05-30 DIAGNOSIS — M25562 Pain in left knee: Secondary | ICD-10-CM

## 2021-05-30 DIAGNOSIS — M7052 Other bursitis of knee, left knee: Secondary | ICD-10-CM

## 2021-05-30 MED ORDER — CEPHALEXIN 500 MG PO CAPS: 500.0000 mg | ORAL_CAPSULE | Freq: Four times a day (QID) | ORAL | 0 refills | Status: AC

## 2021-05-30 MED ORDER — DICLOFENAC SODIUM 75 MG PO TBEC: 75.0000 mg | DELAYED_RELEASE_TABLET | Freq: Two times a day (BID) | ORAL | 0 refills | Status: AC

## 2021-05-30 NOTE — ED Triage Notes (Signed)
Patient c/o left knee pain that started last night.  Patient denies fall or injury.

## 2021-05-30 NOTE — Discharge Instructions (Addendum)
Your condition is consistent with bursitis.  Since you are off and directly on your knees, this can inflame the bursa which are fluid-filled sacs.  This is treated with anti-inflammatory medication, local cryotherapy or ice, knee braces and trying to stay off of your knees for a couple of days.  Can also take Tylenol.  Try to elevate that legs.  Put a pillow under her knees at night and lie on your back.  The redness is likely due to inflammation.  The redness and swelling should resolve as the inflammation goes down, but if it does not then you may need to start antibiotics.  Sometimes the bursa can get infected.  I do not believe you have an infection but if the redness and swelling are worsening then you need to start the antibiotic and follow-up here or with PCP as soon as possible.  If you develop a fever or acute worsening of your symptoms need to go to the emergency department.  YOU CAN ALSO PURCHASE OVER THE COUNTER VOLTAREN GEL WHICH CAN BE VERY HELPFUL.

## 2021-05-30 NOTE — ED Provider Notes (Signed)
[facility]    CSN: [id number] Arrival date & time: 05/30/21  1314      History   Chief Complaint Chief Complaint  Patient presents with   Knee Pain    left    HPI Charles Burgess is a 40 y.o. male presenting for left anterior knee pain, redness, and swelling since last night. He says he did not injure his knee or fall. 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 Pain is constant, aching 7/10. No numbness, weakness, tingling, gait instability. Has taken ibuprofen yesterday for pain. He says he applied ice to the area yesterday and it seems less swollen today. Denies fevers or abrasions. Patient concerned about crawling on his knees and potentially injury his knee bones when crawling over rocks. No history of knee problems. No other concerns.  HPI  History reviewed. No pertinent past medical history.  There are no problems to display for this patient.   History reviewed. No pertinent surgical history.     Home Medications    Prior to Admission medications   Medication Sig Start Date End Date Taking? Authorizing Provider  cephALEXin (KEFLEX) 500 MG capsule Take 1 capsule (500 mg total) by mouth 4 (four) times daily for 7 days. 05/30/21 06/06/21 Yes Shirlee Latch, PA-C  diclofenac (VOLTAREN) 75 MG EC tablet Take 1 tablet (75 mg total) by mouth 2 (two) times daily for 15 days. 05/30/21 06/14/21 Yes Shirlee Latch, PA-C  Hydrocortisone Acetate 2.5 % CREA Apply 1 application topically 2 (two) times daily. 09/25/19   Orland Mustard, MD  nystatin-triamcinolone ointment [facility]) Apply 1 application topically 2 (two) times daily. Patient not taking: No sig reported 11/21/18   Helane Rima, DO    Family History Family History  Problem Relation Age of Onset   Hypertension Father    Breast cancer Maternal Grandmother     Social History Social History   Tobacco Use   Smoking status: Former    Pack  years: 0.00    Types: Cigarettes    Quit date: 11/21/2013    Years since quitting: 7.5   Smokeless tobacco: Never  Substance Use Topics   Alcohol use: Yes    Comment: social   Drug use: No     Allergies   Patient has no known allergies.   Review of Systems Review of Systems  Constitutional:  Negative for fatigue and fever.  Musculoskeletal:  Positive for arthralgias. Negative for gait problem and joint swelling.  Skin:  Positive for color change. Negative for rash and wound.  Neurological:  Negative for weakness and numbness.    Physical Exam Triage Vital Signs ED Triage Vitals  Enc Vitals Group     BP 05/30/21 1334 (!) 141/100     Pulse Rate 05/30/21 1334 74     Resp 05/30/21 1334 16     Temp 05/30/21 1334 98.6 F (37 C)     Temp Source 05/30/21 1334 Oral     SpO2 05/30/21 1334 97 %     Weight 05/30/21 1333 215 lb (97.5 kg)     Height 05/30/21 1333 5\' 8"  (1.727 m)     Head Circumference --      Peak Flow --      Pain Score 05/30/21 1332 7     Pain Loc --      Pain Edu? --      Excl. in GC? --  No data found.  Updated Vital Signs BP (!) 141/100 (BP Location: Left Arm)   Pulse 74   Temp 98.6 F (37 C) (Oral)   Resp 16   Ht 5\' 8"  (1.727 m)   Wt 215 lb (97.5 kg)   SpO2 97%   BMI 32.69 kg/m       Physical Exam Vitals and nursing note reviewed.  Constitutional:      General: He is not in acute distress.    Appearance: Normal appearance. He is well-developed. He is not ill-appearing.  HENT:     Head: Normocephalic and atraumatic.  Eyes:     General: No scleral icterus.    Conjunctiva/sclera: Conjunctivae normal.  Cardiovascular:     Rate and Rhythm: Normal rate and regular rhythm.     Heart sounds: Normal heart sounds.  Pulmonary:     Effort: Pulmonary effort is normal. No respiratory distress.     Breath sounds: Normal breath sounds.  Musculoskeletal:     Cervical back: Neck supple.     Left knee: Swelling (infrapatellar-mild/moderate anterior  knee swelling in area marked in picture. W/ associated mild warmth and erythema) and erythema present. No effusion or ecchymosis. Normal range of motion. Tenderness present over the patellar tendon.       Legs:  Skin:    General: Skin is warm and dry.  Neurological:     General: No focal deficit present.     Mental Status: He is alert. Mental status is at baseline.     Motor: No weakness.     Gait: Gait normal.  Psychiatric:        Mood and Affect: Mood normal.        Behavior: Behavior normal.        Thought Content: Thought content normal.     UC Treatments / Results  Labs (all labs ordered are listed, but only abnormal results are displayed) Labs Reviewed - No data to display  EKG   Radiology DG Knee Complete 4 Views Left  Result Date: 05/30/2021 CLINICAL DATA:  Acute left knee pain without known injury. EXAM: LEFT KNEE - COMPLETE 4+ VIEW COMPARISON:  None. FINDINGS: No evidence of fracture, dislocation, or joint effusion. No evidence of arthropathy or other focal bone abnormality. Soft tissues are unremarkable. IMPRESSION: Negative. Electronically Signed   By: 07/30/2021 M.D.   On: 05/30/2021 14:42    Procedures Procedures (including critical care time)  Medications Ordered in UC Medications - No data to display  Initial Impression / Assessment and Plan / UC Course  I have reviewed the triage vital signs and the nursing notes.  Pertinent labs & imaging results that were available during my care of the patient were reviewed by me and considered in my medical decision making (see chart for details).  40 year old male presenting with onset of left anterior/infrapatellar pain, redness and swelling last night.  Symptoms are little better today after applying ice last night.  Has also taken ibuprofen last night but none today.  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  He says that he is on his knees quite often.  Patient concerned about possible fractures or injury to  bone.  X-ray of knee obtained today.  Patient's clinical presentation is most consistent with infrapatellar bursitis, likely due to the nature of his work.   Independently reviewed x-ray today.  X-ray overread is negative.  Reviewed this with patient.  Again it is consistent with infrapatellar bursitis.  Advised NSAIDs,  cryotherapy, elevation and use of the knee brace.  Very low suspicion for septic bursitis.  He does not have a fever or any systemic symptoms and I did not see any lacerations/abrasions or abscesses.  I did print him a prescription for Keflex to fill if the area of redness is spreading or symptoms are worsening.  Reviewed ED precautions with patient advised to go to ED for any severe acute worsening his pain or if he has a fever or cannot move his knee due to swelling/pain.  Patient agreeable.  Final Clinical Impressions(s) / UC Diagnoses   Final diagnoses:  Infrapatellar bursitis of left knee  Acute pain of left knee     Discharge Instructions      Your condition is consistent with bursitis.  Since you are off and directly on your knees, this can inflame the bursa which are fluid-filled sacs.  This is treated with anti-inflammatory medication, local cryotherapy or ice, knee braces and trying to stay off of your knees for a couple of days.  Can also take Tylenol.  Try to elevate that legs.  Put a pillow under her knees at night and lie on your back.  The redness is likely due to inflammation.  The redness and swelling should resolve as the inflammation goes down, but if it does not then you may need to start antibiotics.  Sometimes the bursa can get infected.  I do not believe you have an infection but if the redness and swelling are worsening then you need to start the antibiotic and follow-up here or with PCP as soon as possible.  If you develop a fever or acute worsening of your symptoms need to go to the emergency department.  YOU CAN ALSO PURCHASE OVER THE COUNTER VOLTAREN  GEL WHICH CAN BE VERY HELPFUL.     ED Prescriptions     Medication Sig Dispense Auth. Provider   diclofenac (VOLTAREN) 75 MG EC tablet Take 1 tablet (75 mg total) by mouth 2 (two) times daily for 15 days. 30 tablet Eusebio Friendly B, PA-C   cephALEXin (KEFLEX) 500 MG capsule Take 1 capsule (500 mg total) by mouth 4 (four) times daily for 7 days. 28 capsule Shirlee Latch, PA-C      I have reviewed the PDMP during this encounter.   Shirlee Latch, PA-C 05/30/21 1456

## 2022-05-24 ENCOUNTER — Ambulatory Visit
Admission: EM | Admit: 2022-05-24 | Discharge: 2022-05-24 | Disposition: A | Discharge status: Home / Self Care | Payer: BC Managed Care – PPO | Attending: Emergency Medicine | Admitting: Emergency Medicine

## 2022-05-24 ENCOUNTER — Encounter: Payer: Self-pay | Admitting: Emergency Medicine

## 2022-05-24 DIAGNOSIS — S46211A Strain of muscle, fascia and tendon of other parts of biceps, right arm, initial encounter: Secondary | ICD-10-CM

## 2022-05-24 NOTE — ED Triage Notes (Signed)
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  Patient reports pain and swelling at the site.

## 2022-05-24 NOTE — ED Provider Notes (Signed)
[facility]    CSN: [id number] Arrival date & time: 05/24/22  1408      History   Chief Complaint Chief Complaint  Patient presents with   Arm Pain    right    HPI Charles Burgess is a 41 y.o. male.   HPI  41 year old male here for evaluation of right arm pain.  Patient reports that he was working on his truck last night and was trying to dislodge a knot that was stuck.  He was applying significant force to the end of a ratchet when he felt a pop in his right upper arm just above his elbow.  He also states that after that he noticed there is a deformity in his bicep muscle.  He has a bulge and tenderness in the apex of the antecubital fossa.  He denies any numbness, tingling, weakness in his hand.  His wife is an Char[employer] fundraiser and thinks that he damaged his bicep muscle.  History reviewed. No pertinent past medical history.  There are no problems to display for this patient.   History reviewed. No pertinent surgical history.     Home Medications    Prior to Admission medications   Not on File    Family History Family History  Problem Relation Age of Onset   Hypertension Father    Breast cancer Maternal Grandmother     Social History Social History   Tobacco Use   Smoking status: Former    Types: Cigarettes    Quit date: 11/21/2013    Years since quitting: 8.5   Smokeless tobacco: Never  Vaping Use   Vaping Use: Never used  Substance Use Topics   Alcohol use: Yes    Comment: social   Drug use: No     Allergies   Patient has no known allergies.   Review of Systems Review of Systems  Constitutional:  Negative for fever.  Musculoskeletal:  Positive for myalgias. Negative for arthralgias and joint swelling.  Skin:  Negative for color change.  Hematological: Negative.   Psychiatric/Behavioral: Negative.      Physical Exam Triage Vital Signs ED Triage Vitals  Enc Vitals Group     BP 05/24/22 1455 (!) 143/102     Pulse Rate 05/24/22 1455 71      Resp 05/24/22 1455 15     Temp 05/24/22 1455 98.1 F (36.7 C)     Temp Source 05/24/22 1455 Oral     SpO2 05/24/22 1455 97 %     Weight 05/24/22 1454 205 lb (93 kg)     Height 05/24/22 1454 5\' 8"  (1.727 m)     Head Circumference --      Peak Flow --      Pain Score 05/24/22 1454 4     Pain Loc --      Pain Edu? --      Excl. in GC? --    No data found.  Updated Vital Signs BP (!) 143/102 (BP Location: Left Arm)   Pulse 71   Temp 98.1 F (36.7 C) (Oral)   Resp 15   Ht 5\' 8"  (1.727 m)   Wt 205 lb (93 kg)   SpO2 97%   BMI 31.17 kg/m   Visual Acuity Right Eye Distance:   Left Eye Distance:   Bilateral Distance:    Right Eye Near:   Left Eye Near:    Bilateral Near:     Physical Exam Vitals and nursing note reviewed.  Constitutional:  Appearance: Normal appearance. He is not ill-appearing.  HENT:     Head: Normocephalic and atraumatic.  Musculoskeletal:        General: Swelling, tenderness, deformity and signs of injury present. Normal range of motion.  Skin:    General: Skin is warm and dry.     Capillary Refill: Capillary refill takes less than 2 seconds.     Findings: No bruising or erythema.  Neurological:     General: No focal deficit present.     Mental Status: He is alert and oriented to person, place, and time.  Psychiatric:        Mood and Affect: Mood normal.        Behavior: Behavior normal.        Thought Content: Thought content normal.        Judgment: Judgment normal.     UC Treatments / Results  Labs (all labs ordered are listed, but only abnormal results are displayed) Labs Reviewed - No data to display  EKG   Radiology No results found.  Procedures Procedures (including critical care time)  Medications Ordered in UC Medications - No data to display  Initial Impression / Assessment and Plan / UC Course  I have reviewed the triage vital signs and the nursing notes.  Pertinent labs & imaging results that were available  during my care of the patient were reviewed by me and considered in my medical decision making (see chart for details).  Patient is a very pleasant, nontoxic-appearing 41 year old male here for evaluation of right arm pain that began last night after applying significant force to the shaft of a ratchet while trying to loosen a knot on his truck.  He states that he felt a pop and has been having pain with movement of his arm or touching the truck was elbow ever since.  No numbness, tingling, or weakness in his hand.  When he flexes his bicep he does notice a deformity on the distal inner aspect.  On exam patient's right elbow is in normal anatomical alignment.  When holding his right arm extended in anatomical position there is a visible bulge that extends from the middle antecubital fossa medially to the distal bicep.  This bulge is not tender but at the origin of it at the apex of the antecubital fossa patient has marked tenderness.  There is also a palpable defect in the bicep muscle body on the medial aspect.  There is no ecchymosis noted.  His radial and ulnar pulses are 2+ and his grip strength is 5/5.  I am concerned that patient has a bicep injury and have advised him that he needs to rest his arm is much as possible and follow-up with orthopedics as he needs imaging to determine if there is damage to the muscle body which we cannot perform here.  I have put him on a 5 pound weight restriction and advised him he can use ibuprofen as needed for pain as well as apply ice.  I advised him to follow-up with [facility] at their walk-in clinic tomorrow morning.  Work note was provided.   Final Clinical Impressions(s) / UC Diagnoses   Final diagnoses:  Biceps muscle tear, right, initial encounter     Discharge Instructions      Rest your right arm and do not lift anything over 5 pounds.  Take OTC Ibuprofen as needed for pain.  You can apply ice for 20 minutes at a time, 2-3 times a day to help  with pain and inflammation.  [facility]     ED Prescriptions   None    PDMP not reviewed this encounter.   Becky Augustayan, Carmin Alvidrez, NP 05/24/22 1520

## 2022-05-24 NOTE — Discharge Instructions (Signed)
Rest your right arm and do not lift anything over 5 pounds.  Take OTC Ibuprofen as needed for pain.  You can apply ice for 20 minutes at a time, 2-3 times a day to help with pain and inflammation.  You need to follow up with [facility] tomorrow for evaluation.

## 2024-06-16 ENCOUNTER — Ambulatory Visit
Admission: RE | Admit: 2024-06-16 | Discharge: 2024-06-16 | Disposition: A | Discharge status: Home / Self Care | Source: Ambulatory Visit | Attending: Physician Assistant | Admitting: Physician Assistant

## 2024-06-16 VITALS — BP 139/83 | HR 71 | Temp 98.3°F | Resp 18

## 2024-06-16 DIAGNOSIS — L80 Vitiligo: Secondary | ICD-10-CM | POA: Diagnosis not present

## 2024-06-16 DIAGNOSIS — L089 Local infection of the skin and subcutaneous tissue, unspecified: Secondary | ICD-10-CM

## 2024-06-16 MED ORDER — MUPIROCIN 2 % EX OINT: 1.0000 | TOPICAL_OINTMENT | Freq: Every day | CUTANEOUS | 0 refills | Status: AC

## 2024-06-16 MED ORDER — DESONIDE 0.05 % EX CREA: TOPICAL_CREAM | Freq: Two times a day (BID) | CUTANEOUS | 0 refills | Status: AC

## 2024-06-16 MED ORDER — DOXYCYCLINE HYCLATE 100 MG PO CAPS: 100.0000 mg | ORAL_CAPSULE | Freq: Two times a day (BID) | ORAL | 0 refills | Status: AC

## 2024-06-16 NOTE — ED Provider Notes (Signed)
 Charles Burgess    CSN: [id number] Arrival date & time: 06/16/24  0825      History   Chief Complaint Chief Complaint  Patient presents with   Wound Check    HPI Charles Burgess is a 43 y.o. male.   Patient today with a 3-day history of redness and swelling of his left fifth toe.  He denies any injury including crush injury or stubbing his toe.  He denies any known insect bite or break in skin.  He reports that that has gradually been becoming more red and painful.  Currently pain is rated 8 on a 0-10 pain scale, described as aching, no aggravating alleviating factors notified.  He has been keeping it elevated and applying ice without improvement of symptoms.  He does have a history of recurrent skin infections but has never been told he has MRSA.  He has had abscesses on his legs in the past.  He denies any fever, nausea, vomiting.  Denies any history of diabetes or immunosuppression.  Denies any recent antibiotics.  At the end of the visit patient requested a refill of steroid medication for vitiligo.  He has seen dermatology in the past and has several areas on his volar wrist bilaterally that are stable.  He is requesting a refill of topical steroid to help manage his symptoms.    History reviewed. No pertinent past medical history.  There are no active problems to display for this patient.   Past Surgical History:  Procedure Laterality Date   BICEPS TENDON REPAIR Right        Home Medications    Prior to Admission medications   Medication Sig Start Date End Date Taking? Authorizing Provider  desonide (DESOWEN) 0.05 % cream Apply topically 2 (two) times daily. 06/16/24  Yes Pinchos Topel K, PA-C  doxycycline  (VIBRAMYCIN ) 100 MG capsule Take 1 capsule (100 mg total) by mouth 2 (two) times daily. 06/16/24  Yes Karsyn Jamie K, PA-C  mupirocin ointment (BACTROBAN) 2 % Apply 1 Application topically daily. 06/16/24  Yes Jeanclaude Wentworth, Rocky POUR, PA-C    Family History Family History   Problem Relation Age of Onset   Hypertension Father    Breast cancer Maternal Grandmother     Social History Social History   Tobacco Use   Smoking status: Former    Current packs/day: 0.00    Types: Cigarettes    Quit date: 11/21/2013    Years since quitting: 10.5   Smokeless tobacco: Never  Vaping Use   Vaping status: Never Used  Substance Use Topics   Alcohol use: Yes    Comment: social   Drug use: No     Allergies   Patient has no known allergies.   Review of Systems Review of Systems  Constitutional:  Positive for activity change. Negative for appetite change, fatigue and fever.  Gastrointestinal:  Negative for abdominal pain, diarrhea, nausea and vomiting.  Musculoskeletal:  Positive for arthralgias and gait problem. Negative for joint swelling and myalgias.  Skin:  Positive for color change and wound.  Neurological:  Negative for weakness and numbness.     Physical Exam Triage Vital Signs ED Triage Vitals  Encounter Vitals Group     BP 06/16/24 0837 139/83     Girls Systolic BP Percentile --      Girls Diastolic BP Percentile --      Boys Systolic BP Percentile --      Boys Diastolic BP Percentile --  Pulse Rate 06/16/24 0837 71     Resp 06/16/24 0837 18     Temp 06/16/24 0837 98.3 F (36.8 C)     Temp src --      SpO2 06/16/24 0837 98 %     Weight --      Height --      Head Circumference --      Peak Flow --      Pain Score 06/16/24 0844 8     Pain Loc --      Pain Education --      Exclude from Growth Chart --    No data found.  Updated Vital Signs BP 139/83   Pulse 71   Temp 98.3 F (36.8 C)   Resp 18   SpO2 98%   Visual Acuity Right Eye Distance:   Left Eye Distance:   Bilateral Distance:    Right Eye Near:   Left Eye Near:    Bilateral Near:     Physical Exam Vitals reviewed.  Constitutional:      General: He is awake.     Appearance: Normal appearance. He is well-developed. He is not ill-appearing.     Comments:  Very pleasant male appears stated age in no acute distress sitting comfortably in exam room  HENT:     Head: Normocephalic and atraumatic.   Cardiovascular:     Rate and Rhythm: Normal rate and regular rhythm.     Heart sounds: Normal heart sounds, S1 normal and S2 normal. No murmur heard.    Comments: Capillary refill within 2 seconds left fifth toe Pulmonary:     Effort: Pulmonary effort is normal.     Breath sounds: Normal breath sounds. No stridor. No wheezing, rhonchi or rales.     Comments: Clear to auscultation bilaterally  Skin:    Comments: Erythema noted lateral left toe with small central pustule.  This area was unroofed but there was no significant purulent drainage; minimal serous drainage.  No associated induration or fluctuance.  Hypopigmentation noted in approximately 4 cm x 3 cm area on bilateral volar wrists.   Neurological:     Mental Status: He is alert.   Psychiatric:        Behavior: Behavior is cooperative.      UC Treatments / Results  Labs (all labs ordered are listed, but only abnormal results are displayed) Labs Reviewed - No data to display  EKG   Radiology No results found.  Procedures Procedures (including critical care time)  Medications Ordered in UC Medications - No data to display  Initial Impression / Assessment and Plan / UC Course  I have reviewed the triage vital signs and the nursing notes.  Pertinent labs & imaging results that were available during my care of the patient were reviewed by me and considered in my medical decision making (see chart for details).     Patient is well-appearing, afebrile, nontoxic, nontachycardic.  Concern for infection given clinical presentation.  Low suspicion for osteomyelitis given short duration of symptoms and patient does not have any significant risk factors so plain films were deferred as they would not provide much benefit.  We discussed that this area is not amenable to an I&D but he  was concerned about pustule so this was cleaned with alcohol and then unroofed using a 23-gauge needle.  No significant purulence expressed.  Will start doxycycline  100 mg twice daily for 10 days.  We discussed that he is to avoid prolonged  sun exposure while on this medication due to associated photosensitivity.  He was to keep the area clean with soap and water and apply Bactroban ointment with dressing changes.  We discussed that if this area is not improving within a few days or if anything worsens and he has spread of redness, increasing pain, fever, nausea, vomiting he needs to be seen immediately.  Strict return precautions given.  Excuse note provided.  Patient was provided a refill of desonide to be applied up to twice a day to stable area vitiligo.  Recommended close follow-up with dermatology for further evaluation and management.  We discussed that if anything worsens or changes he should return for reevaluation.  Final Clinical Impressions(s) / UC Diagnoses   Final diagnoses:  Skin infection  Infection of toe  Vitiligo     Discharge Instructions      We are treating you for an infection.  Keep the area clean with soap and water and apply Bactroban ointment with dressing changes daily.  Start doxycycline  100 mg twice daily for 10 days.  Stay out of the sun while on this medication as it will cause you to have sensitivity to the sun and lead to a sunburn.  If this is not improving within a few days please return for reevaluation.  If anything worsens you have increasing pain, redness, fever, nausea, vomiting you need to be seen immediately.  I have refilled your desonide to apply to your wrist.  Follow-up with your dermatologist as we discussed.     ED Prescriptions     Medication Sig Dispense Auth. Provider   doxycycline  (VIBRAMYCIN ) 100 MG capsule Take 1 capsule (100 mg total) by mouth 2 (two) times daily. 20 capsule Loraine Freid K, PA-C   mupirocin ointment (BACTROBAN) 2 %  Apply 1 Application topically daily. 22 g Arasely Akkerman K, PA-C   desonide (DESOWEN) 0.05 % cream Apply topically 2 (two) times daily. 30 g Cletus Paris K, PA-C      PDMP not reviewed this encounter.   Sherrell Rocky POUR, PA-C 06/16/24 [id number]

## 2024-06-16 NOTE — Discharge Instructions (Signed)
 We are treating you for an infection.  Keep the area clean with soap and water and apply Bactroban ointment with dressing changes daily.  Start doxycycline  100 mg twice daily for 10 days.  Stay out of the sun while on this medication as it will cause you to have sensitivity to the sun and lead to a sunburn.  If this is not improving within a few days please return for reevaluation.  If anything worsens you have increasing pain, redness, fever, nausea, vomiting you need to be seen immediately.  I have refilled your desonide to apply to your wrist.  Follow-up with your dermatologist as we discussed.

## 2024-06-16 NOTE — ED Triage Notes (Signed)
 Patient to Urgent Care with complaints of a swollen/ red left sided, pinky toe.  Symptoms x 3 days.  Using ice. No other home interventions.
